# Patient Record
Sex: Male | Born: 1955 | Race: White | Hispanic: No | Marital: Married | Smoking: Never smoker
Health system: Southern US, Community
[De-identification: ages and names within clinical notes are randomized; demographics above are authoritative.]

## PROBLEM LIST (undated history)

## (undated) DIAGNOSIS — N200 Calculus of kidney: Secondary | ICD-10-CM

## (undated) DIAGNOSIS — I10 Essential (primary) hypertension: Secondary | ICD-10-CM

---

## 2014-05-07 ENCOUNTER — Emergency Department (HOSPITAL_BASED_OUTPATIENT_CLINIC_OR_DEPARTMENT_OTHER)
Admission: EM | Admit: 2014-05-07 | Discharge: 2014-05-07 | Disposition: A | Discharge status: Home / Self Care | Payer: BC Managed Care – PPO | Attending: Emergency Medicine | Admitting: Emergency Medicine

## 2014-05-07 ENCOUNTER — Encounter (HOSPITAL_BASED_OUTPATIENT_CLINIC_OR_DEPARTMENT_OTHER): Payer: Self-pay | Admitting: Emergency Medicine

## 2014-05-07 ENCOUNTER — Emergency Department (HOSPITAL_BASED_OUTPATIENT_CLINIC_OR_DEPARTMENT_OTHER): Payer: BC Managed Care – PPO

## 2014-05-07 DIAGNOSIS — R109 Unspecified abdominal pain: Secondary | ICD-10-CM

## 2014-05-07 DIAGNOSIS — Z79899 Other long term (current) drug therapy: Secondary | ICD-10-CM | POA: Insufficient documentation

## 2014-05-07 DIAGNOSIS — R110 Nausea: Secondary | ICD-10-CM | POA: Diagnosis not present

## 2014-05-07 DIAGNOSIS — N200 Calculus of kidney: Secondary | ICD-10-CM | POA: Diagnosis not present

## 2014-05-07 DIAGNOSIS — I10 Essential (primary) hypertension: Secondary | ICD-10-CM | POA: Diagnosis not present

## 2014-05-07 DIAGNOSIS — M549 Dorsalgia, unspecified: Secondary | ICD-10-CM | POA: Insufficient documentation

## 2014-05-07 HISTORY — DX: Calculus of kidney: N20.0

## 2014-05-07 HISTORY — DX: Essential (primary) hypertension: I10

## 2014-05-07 LAB — URINALYSIS, ROUTINE W REFLEX MICROSCOPIC
Bilirubin Urine: NEGATIVE
GLUCOSE, UA: NEGATIVE mg/dL
Ketones, ur: NEGATIVE mg/dL
Nitrite: NEGATIVE
PROTEIN: NEGATIVE mg/dL
Specific Gravity, Urine: 1.025 (ref 1.005–1.030)
Urobilinogen, UA: 0.2 mg/dL (ref 0.0–1.0)
pH: 6 (ref 5.0–8.0)

## 2014-05-07 LAB — URINE MICROSCOPIC-ADD ON

## 2014-05-07 MED ORDER — KETOROLAC TROMETHAMINE 60 MG/2ML IM SOLN
INTRAMUSCULAR | Status: AC
  Filled 2014-05-07: qty 2

## 2014-05-07 MED ORDER — OXYCODONE-ACETAMINOPHEN 5-325 MG PO TABS: 1.0000 | ORAL_TABLET | ORAL | Status: AC | PRN

## 2014-05-07 MED ORDER — TAMSULOSIN HCL 0.4 MG PO CAPS: 0.4000 mg | ORAL_CAPSULE | Freq: Every day | ORAL | Status: AC

## 2014-05-07 MED ORDER — KETOROLAC TROMETHAMINE 60 MG/2ML IM SOLN
60.0000 mg | Freq: Once | INTRAMUSCULAR | Status: AC
  Administered 2014-05-07: 60 mg via INTRAMUSCULAR

## 2014-05-07 NOTE — ED Notes (Signed)
Patient stated he take meds for high BP, but did not take today.

## 2014-05-07 NOTE — ED Notes (Signed)
C/o left flank pain that started on Saturday morning. States pain has been on and off but worse last night and this morning. Hx of kidney stone 20 years ago. Denies any pain with urination and states decrease in urine. C/o nausea. Denies vomiting.

## 2014-05-07 NOTE — Discharge Instructions (Signed)

## 2014-05-07 NOTE — ED Notes (Signed)
Patient transported to CT 

## 2014-05-07 NOTE — ED Provider Notes (Signed)
CSN: [id number]     Arrival date & time 05/07/14  [id number] History   First MD Initiated Contact with Patient 05/07/14 0703     Chief Complaint  Patient presents with  . Flank Pain     (Consider location/radiation/quality/duration/timing/severity/associated sxs/prior Treatment) HPI Comments: Patient presents with left flank pain. He states he has a history of a kidney stone about 20 years ago that passed on its own. He states the pain is currently having feel similar to his past kidney stone. He complains of pain in his left back and left side that started on Saturday morning which was yesterday. States it's been waxing and waning in intensity. Currently it's only a mild discomfort. He denies any difficulty urinating or pain on urination. He's had some nausea but no vomiting. He denies any fevers or chills. He took ibuprofen for the pain but that did not seem to alleviate it. 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  Patient is a 58 y.o. male presenting with flank pain.  Flank Pain Pertinent negatives include no chest pain, no abdominal pain, no headaches and no shortness of breath.    Past Medical History  Diagnosis Date  . Kidney stone   . Hypertension    History reviewed. No pertinent past surgical history. No family history on file. History  Substance Use Topics  . Smoking status: Never Smoker   . Smokeless tobacco: Not on file  . Alcohol Use: Yes     Comment: occasional     Review of Systems  Constitutional: Negative for fever, chills, diaphoresis and fatigue.  HENT: Negative for congestion, rhinorrhea and sneezing.   Eyes: Negative.   Respiratory: Negative for cough, chest tightness and shortness of breath.   Cardiovascular: Negative for chest pain and leg swelling.  Gastrointestinal: Positive for nausea. Negative for vomiting, abdominal pain, diarrhea and blood in stool.  Genitourinary: Positive for flank pain.  Negative for frequency, hematuria and difficulty urinating.  Musculoskeletal: Positive for back pain. Negative for arthralgias.  Skin: Negative for rash.  Neurological: Negative for dizziness, speech difficulty, weakness, numbness and headaches.      Allergies  Review of patient's allergies indicates no known allergies.  Home Medications   Prior to Admission medications   Medication Sig Start Date End Date Taking? Authorizing Provider  Losartan Potassium (COZAAR PO) Take by mouth. Pt thinks this is the name of his b/p med   Yes Historical Provider, MD  oxyCODONE-acetaminophen (PERCOCET) 5-325 MG per tablet Take 1-2 tablets by mouth every 4 (four) hours as needed. 05/07/14   Rolan BuccoMelanie Hannia Matchett, MD  tamsulosin (FLOMAX) 0.4 MG CAPS capsule Take 1 capsule (0.4 mg total) by mouth daily. 05/07/14   Rolan BuccoMelanie Dossie Swor, MD   BP 173/97  Pulse 84  Temp(Src) 97.9 F (36.6 C) (Oral)  Resp 18  Ht 5\' 7"  (1.702 m)  Wt 250 lb (113.399 kg)  BMI 39.15 kg/m2  SpO2 98% Physical Exam  Constitutional: He is oriented to person, place, and time. He appears well-developed and well-nourished.  HENT:  Head: Normocephalic and atraumatic.  Eyes: Pupils are equal, round, and reactive to light.  Neck: Normal range of motion. Neck supple.  Cardiovascular: Normal rate, regular rhythm and normal heart sounds.   Pulmonary/Chest: Effort normal and breath sounds normal. No respiratory distress. He has no wheezes. He has no rales. He exhibits no tenderness.  Abdominal: Soft. Bowel sounds are normal. There is tenderness (mild TTP left  flank). There is no rebound and no guarding.  Musculoskeletal: Normal range of motion. He exhibits no edema.  Lymphadenopathy:    He has no cervical adenopathy.  Neurological: He is alert and oriented to person, place, and time.  Skin: Skin is warm and dry. No rash noted.  Psychiatric: He has a normal mood and affect.    ED Course  Procedures (including critical care time) Labs  Review Labs Reviewed  URINALYSIS, ROUTINE W REFLEX MICROSCOPIC - Abnormal; Notable for the following:    Hgb urine dipstick TRACE (*)    Leukocytes, UA SMALL (*)    All other components within normal limits  URINE MICROSCOPIC-ADD ON - Abnormal; Notable for the following:    Bacteria, UA FEW (*)    Casts HYALINE CASTS (*)    All other components within normal limits    Imaging Review Ct Abdomen Pelvis Wo Contrast  05/07/2014   CLINICAL DATA:  Patient complaining of left-sided back pain for 1 day. History of renal stones. No history of prior surgery. History of hypertension.  EXAM: CT ABDOMEN AND PELVIS WITHOUT CONTRAST  TECHNIQUE: Multidetector CT imaging of the abdomen and pelvis was performed following the standard protocol without IV contrast.  COMPARISON:  None.  FINDINGS: 6 mm stone lies in the proximal left ureter causing mild left hydronephrosis as well as left renal edema and perinephric stranding. No other left ureteral stone.  There are bilateral nonobstructing intrarenal stones. Possible upper pole right renal cyst, not well defined. No other evidence of a renal mass. Right renal collecting system and ureter are unremarkable. Normal bladder.  Lung bases are essentially clear.  Heart is normal in size.  Diffuse fatty infiltration of the liver. Liver is normal in size. No liver mass or focal lesion.  Spleen, gallbladder, pancreas, adrenal glands:  Normal.  Prominent lymph nodes along the gastrohepatic ligament, the largest measuring 11 mm in short axis. No other adenopathy. No abnormal fluid collections.  Normal bowel.  Normal appendix.  Mild degenerative changes of the lower thoracic spine and lower lumbar spine. No osteoblastic or osteolytic lesions.  IMPRESSION: 1. 6 mm stone in the proximal left ureter causing mild left hydronephrosis. No other acute finding. 2. Bilateral nonobstructing intrarenal stones. 3. Diffuse hepatic steatosis.   Electronically Signed   By: Amie Portlandavid  Ormond M.D.   On:  05/07/2014 07:46     EKG Interpretation None      MDM   Final diagnoses:  Flank pain  Kidney stone    Patient has a proximal 6 mm ureteral stone. His pain is controlled today after Toradol. 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 He is planning on driving back tomorrow. He was given a prescription for Percocet and Flomax. He was also advised that he can use ibuprofen for pain. He is aware that he cannot drive while taking narcotic medications. I gave him a referral to a local urologist if he is stuck urine [place]. I did advise him that he will need to get in touch with his urologist in [place] as soon as he gets back home. I advised him that this stone has about a 50% chance of passing and he will likely need urology follow-up. I advised him to be seen immediately if he has worsening pain, fevers or vomiting. He is given a urine strainer to use.    Rolan BuccoMelanie Meeka Cartelli, MD 05/07/14 [phone]

## 2016-02-01 IMAGING — CT CT ABD-PELV W/O CM
2 of 4 series · 16 of 46 positions shown, 18 images · non-contrast
Comparison: None.

CLINICAL DATA: Patient complaining of left-sided back pain for 1
day. History of renal stones. No history of prior surgery. History
of hypertension.

EXAM:
CT ABDOMEN AND PELVIS WITHOUT CONTRAST
TECHNIQUE: Multidetector CT imaging of the abdomen and pelvis was performed
following the standard protocol without IV contrast.

[Series 2: renal stone > 200 lbs 5.0 b31f · axial · 0.93mm/px · z∈[-600,-165]mm · 13 of 95 slices shown, 15 images]
[im 4/95  soft-tissue]
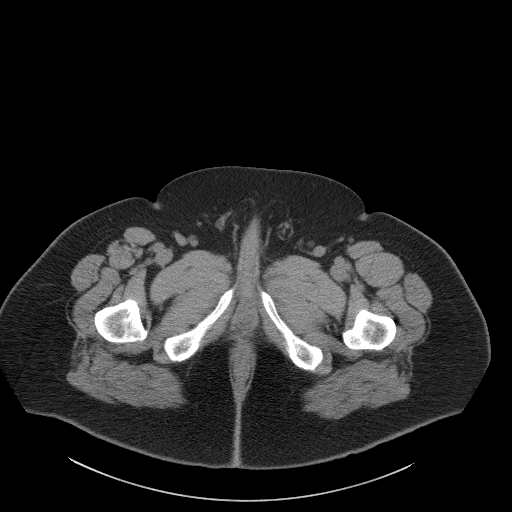
[im 4/95  bone]
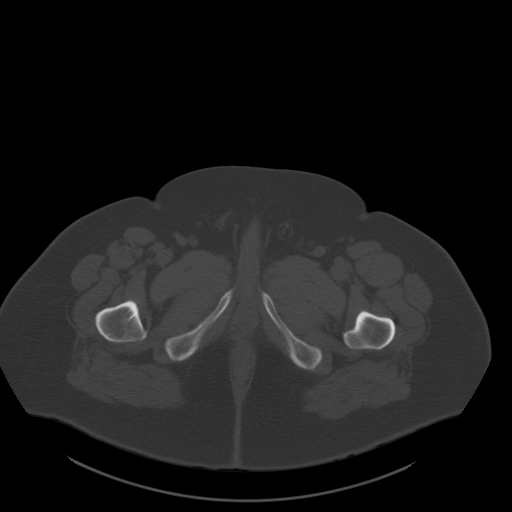
[im 12/95  soft-tissue]
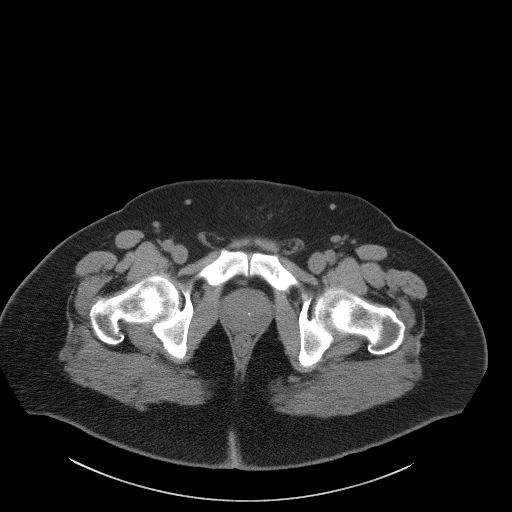
[im 20/95  soft-tissue]
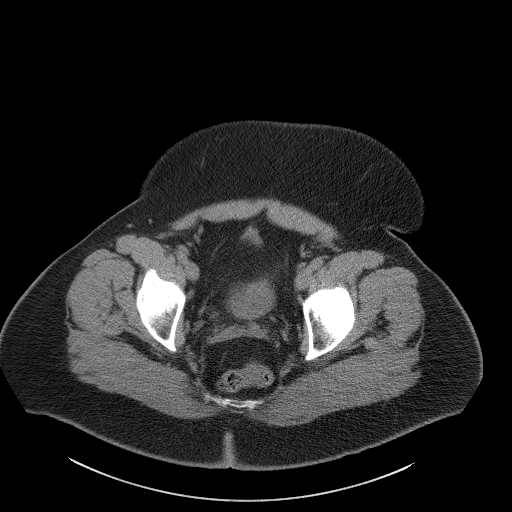
[im 28/95  soft-tissue]
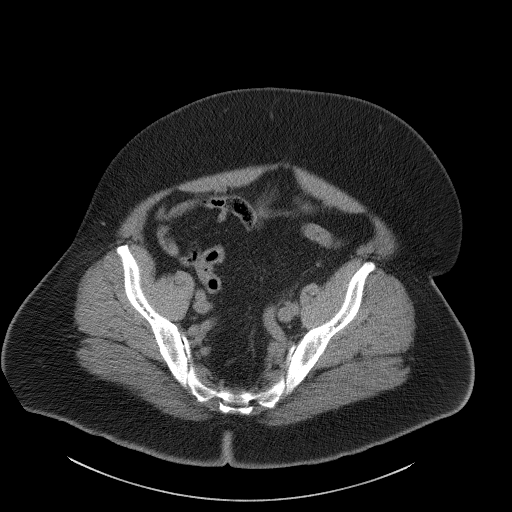
[im 32/95  soft-tissue]
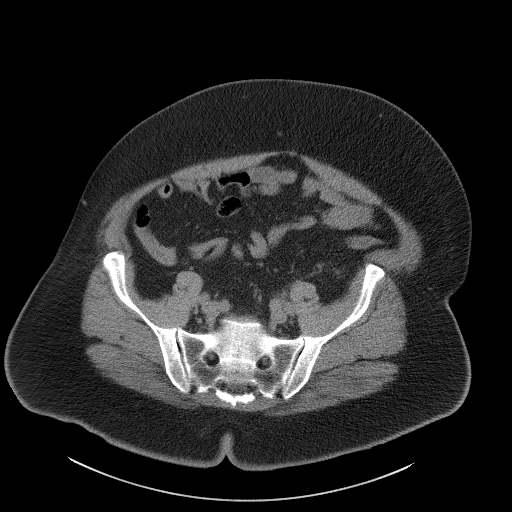
[im 40/95  soft-tissue]
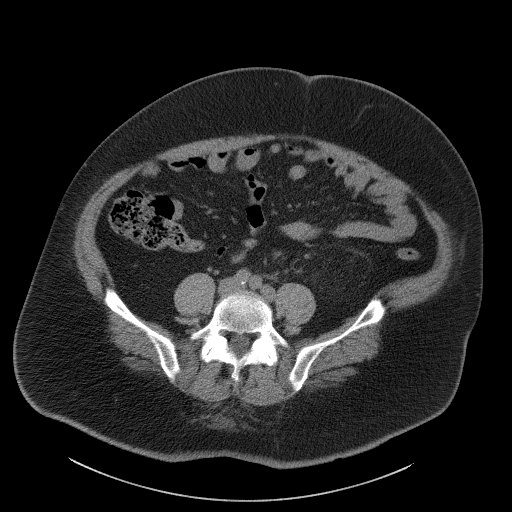
[im 48/95  soft-tissue]
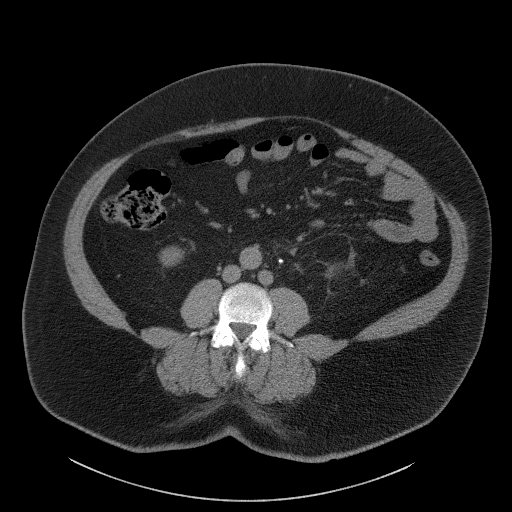
[im 55/95  soft-tissue]
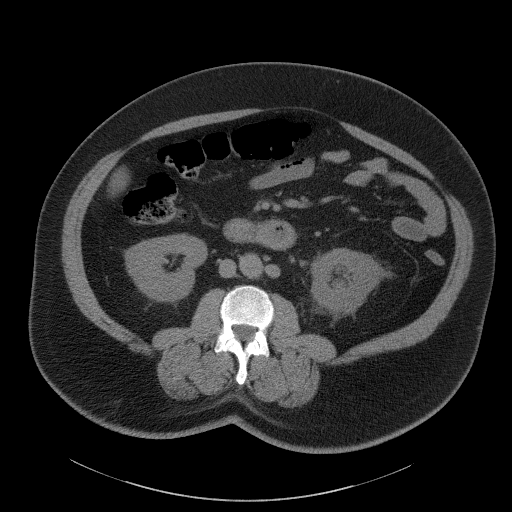
[im 63/95  soft-tissue]
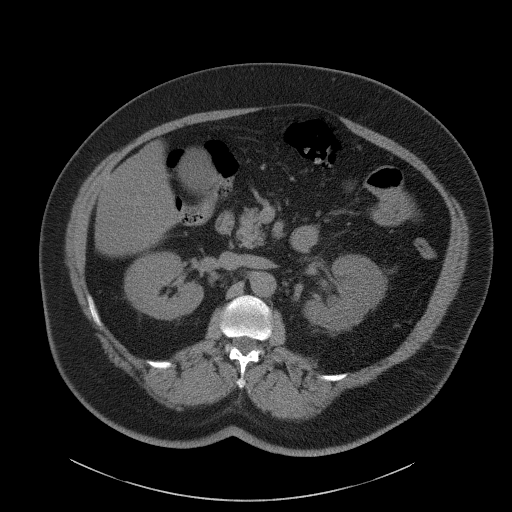
[im 63/95  bone]
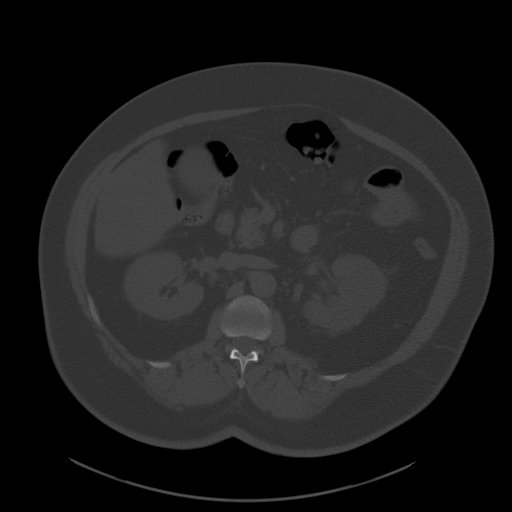
[im 67/95  soft-tissue]
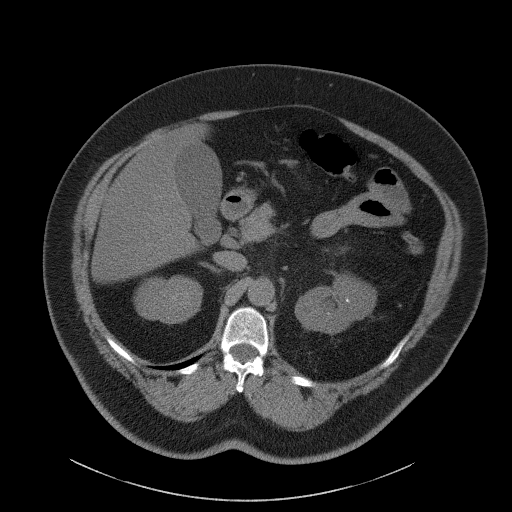
[im 75/95  soft-tissue]
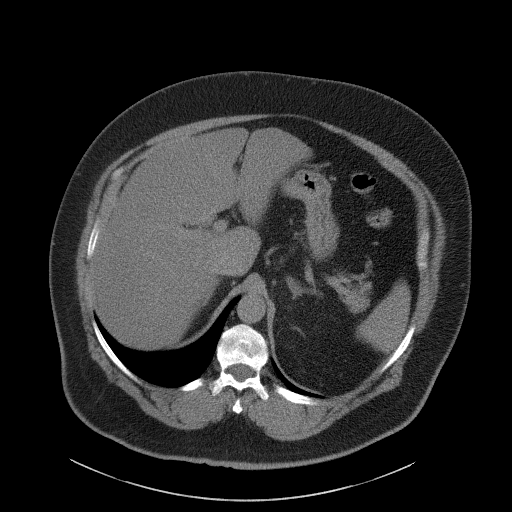
[im 83/95  soft-tissue]
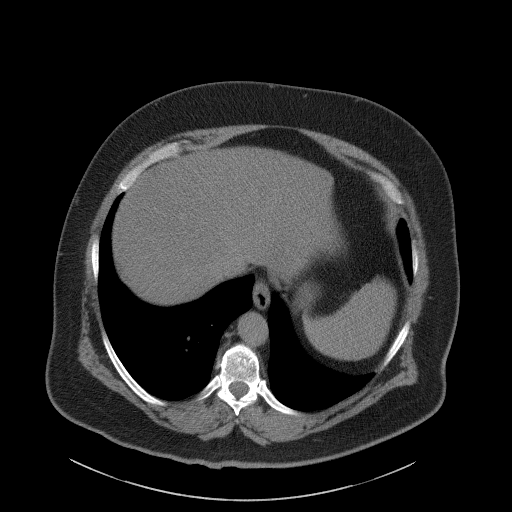
[im 91/95  soft-tissue]
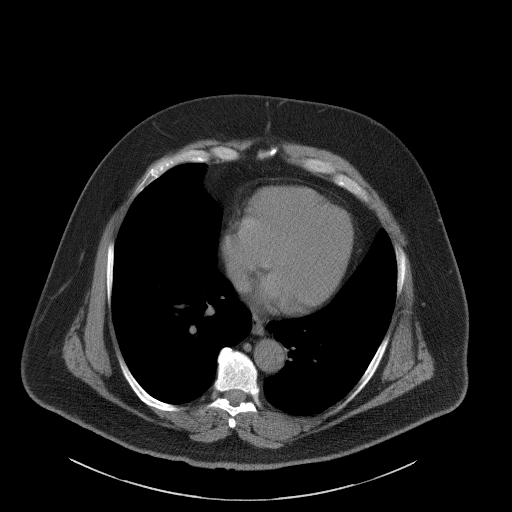

[Series 5: renal stone 3.0 coronal · coronal · 1.09mm/px · 3 of 116 slices shown]
[im 39/116  soft-tissue]
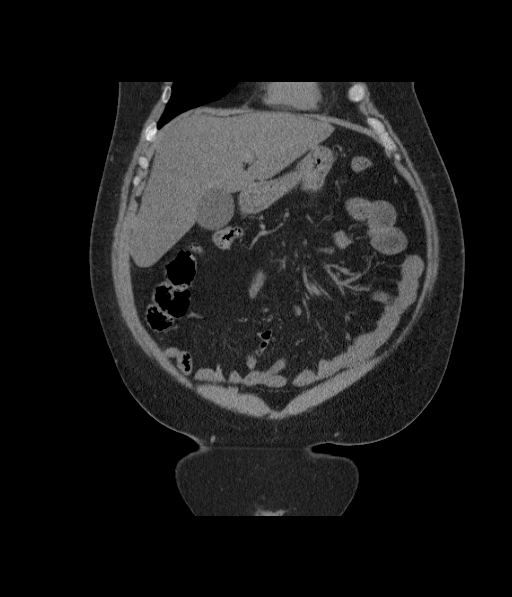
[im 52/116  soft-tissue]
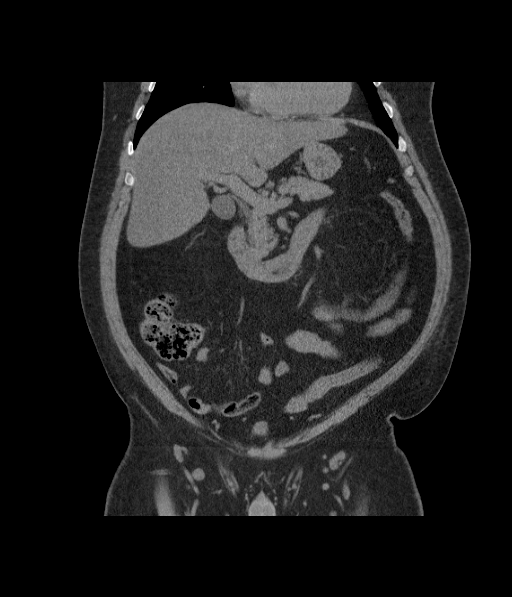
[im 64/116  soft-tissue]
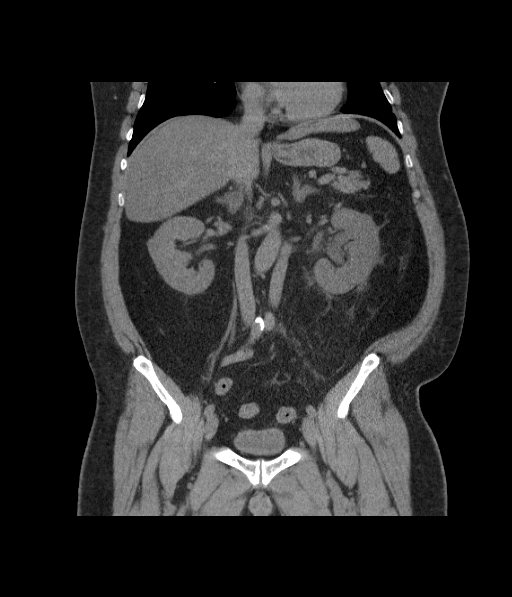

[16 of 46 positions shown; findings below may reference images not displayed]

FINDINGS: 6 mm stone lies in the proximal left ureter causing mild left
hydronephrosis as well as left renal edema and perinephric
stranding. No other left ureteral stone.

There are bilateral nonobstructing intrarenal stones. Possible upper
pole right renal cyst, not well defined. No other evidence of a
renal mass. Right renal collecting system and ureter are
unremarkable. Normal bladder.

Lung bases are essentially clear.  Heart is normal in size.

Diffuse fatty infiltration of the liver. Liver is normal in size. No
liver mass or focal lesion.

Spleen, gallbladder, pancreas, adrenal glands:  Normal.

Prominent lymph nodes along the gastrohepatic ligament, the largest
measuring 11 mm in short axis. No other adenopathy. No abnormal
fluid collections.

Normal bowel.  Normal appendix.

Mild degenerative changes of the lower thoracic spine and lower
lumbar spine. No osteoblastic or osteolytic lesions.
IMPRESSION: 1. 6 mm stone in the proximal left ureter causing mild left
hydronephrosis. No other acute finding.
2. Bilateral nonobstructing intrarenal stones.
3. Diffuse hepatic steatosis.
